# Patient Record
Sex: Female | Born: 2005 | Race: Black or African American | Hispanic: No | Marital: Single | Smoking: Current every day smoker
Health system: Southern US, Community
[De-identification: ages and names within clinical notes are randomized; demographics above are authoritative.]

---
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
## 2024-01-11 ENCOUNTER — Other Ambulatory Visit: Payer: Self-pay

## 2024-01-11 ENCOUNTER — Emergency Department (HOSPITAL_COMMUNITY)
Admission: EM | Admit: 2024-01-11 | Discharge: 2024-01-12 | Discharge status: Against Medical Advice | Attending: Emergency Medicine | Admitting: Emergency Medicine

## 2024-01-11 DIAGNOSIS — R1110 Vomiting, unspecified: Secondary | ICD-10-CM | POA: Insufficient documentation

## 2024-01-11 DIAGNOSIS — Z5321 Procedure and treatment not carried out due to patient leaving prior to being seen by health care provider: Secondary | ICD-10-CM | POA: Diagnosis not present

## 2024-01-11 DIAGNOSIS — R6883 Chills (without fever): Secondary | ICD-10-CM | POA: Insufficient documentation

## 2024-01-11 LAB — HCG, SERUM, QUALITATIVE: Preg, Serum: NEGATIVE

## 2024-01-11 LAB — CBC
HCT: 36.8 % (ref 36.0–46.0)
Hemoglobin: 11.9 g/dL — ABNORMAL LOW (ref 12.0–15.0)
MCH: 29.3 pg (ref 26.0–34.0)
MCHC: 32.3 g/dL (ref 30.0–36.0)
MCV: 90.6 fL (ref 80.0–100.0)
Platelets: 251 K/uL (ref 150–400)
RBC: 4.06 MIL/uL (ref 3.87–5.11)
RDW: 13.1 % (ref 11.5–15.5)
WBC: 9.2 K/uL (ref 4.0–10.5)
nRBC: 0 % (ref 0.0–0.2)

## 2024-01-11 MED ORDER — ONDANSETRON 4 MG PO TBDP
4.0000 mg | ORAL_TABLET | Freq: Once | ORAL | Status: AC
Start: 2024-01-11 — End: 2024-01-11
  Administered 2024-01-11: 4 mg via ORAL
  Filled 2024-01-11: qty 1

## 2024-01-11 NOTE — ED Triage Notes (Signed)
 Pt arrived from home via POV c/o emesis x 4 hours and chills.

## 2024-01-11 NOTE — ED Notes (Signed)
 Pt states she can not provided urine sample at this time that she went just before coming

## 2024-01-12 LAB — COMPREHENSIVE METABOLIC PANEL WITH GFR
ALT: 12 U/L (ref 0–44)
AST: 20 U/L (ref 15–41)
Albumin: 4.6 g/dL (ref 3.5–5.0)
Alkaline Phosphatase: 50 U/L (ref 38–126)
Anion gap: 15 (ref 5–15)
BUN: 8 mg/dL (ref 6–20)
CO2: 17 mmol/L — ABNORMAL LOW (ref 22–32)
Calcium: 9.2 mg/dL (ref 8.9–10.3)
Chloride: 104 mmol/L (ref 98–111)
Creatinine, Ser: 0.84 mg/dL (ref 0.44–1.00)
GFR, Estimated: >60 mL/min (ref >60)
Glucose, Bld: 146 mg/dL — ABNORMAL HIGH (ref 70–99)
Potassium: 3.5 mmol/L (ref 3.5–5.1)
Sodium: 136 mmol/L (ref 135–145)
Total Bilirubin: 0.6 mg/dL (ref 0.0–1.2)
Total Protein: 7.7 g/dL (ref 6.5–8.1)

## 2024-01-12 LAB — LIPASE, BLOOD: Lipase: 23 U/L (ref 11–51)

## 2024-01-12 NOTE — ED Notes (Signed)
 Pt left the waiting room and did not return.

## 2024-01-12 NOTE — ED Notes (Addendum)
 Pt. Left

## 2024-03-04 ENCOUNTER — Ambulatory Visit
Admission: EM | Admit: 2024-03-04 | Discharge: 2024-03-04 | Disposition: A | Discharge status: Home / Self Care | Attending: Nurse Practitioner | Admitting: Nurse Practitioner

## 2024-03-04 DIAGNOSIS — B001 Herpesviral vesicular dermatitis: Secondary | ICD-10-CM

## 2024-03-04 MED ORDER — VALACYCLOVIR HCL 1 G PO TABS: 2000.0000 mg | ORAL_TABLET | Freq: Two times a day (BID) | ORAL | 0 refills | Status: AC

## 2024-03-04 NOTE — ED Provider Notes (Signed)
 [facility]    CSN: [id number] Arrival date & time: 03/04/24  1227      History   Chief Complaint No chief complaint on file.   HPI Leslie Ortega is a 18 y.o. female.   The history is provided by the patient.   Patient presents for complaints of swelling with blisters to the right upper lip.  Patient states symptoms have been present for the past 2 days.  She states that the area is painful and tingling.  She denies injury, trauma, fever, or chills.  Patient states she has been using Blistex with minimal relief of her symptoms. Denies prior history of HSV. Patient was seen for the same or similar symptoms in October and treated with Augmentin .   History reviewed. No pertinent past medical history.  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 No pertinent surgical history.  OB History   No obstetric history on file.      Home Medications    Prior to Admission medications  Medication Sig Start Date End Date Taking? Authorizing Provider  valACYclovir  (VALTREX ) 1000 MG tablet Take 2 tablets (2,000 mg total) by mouth 2 (two) times daily for 1 day. 03/04/24 03/05/24 Yes Leath-Warren, Etta PARAS, NP  acetaminophen  (TYLENOL ) 325 MG tablet Take 2 tablets (650 mg total) by mouth every 6 (six) hours as needed. 07/28/22   Ladora Congress, PA  albuterol  (VENTOLIN  HFA) 108 (90 Base) MCG/ACT inhaler Inhale 2 puffs into the lungs every 6 (six) hours as needed. 07/24/23   Leath-Warren, Etta PARAS, NP  amoxicillin -clavulanate (AUGMENTIN ) 875-125 MG  tablet Take 1 tablet by mouth every 12 (twelve) hours. 12/22/23   Mayer, Jodi R, NP  dicyclomine  (BENTYL ) 20 MG tablet Take 1 tablet (20 mg total) by mouth 2 (two) times daily as needed for spasms. 08/17/22   Silver Wonda LABOR, PA  famotidine  (PEPCID ) 20 MG tablet Take 1 tablet (20 mg total) by mouth 2 (two) times daily. 08/18/22   Blue, Soijett A, PA-C  ibuprofen  (ADVIL ) 400 MG tablet Take 1 tablet (400 mg total) by mouth every 6 (six) hours as needed. 07/28/22   Ladora Congress, PA  ibuprofen  (ADVIL ,MOTRIN ) 400 MG tablet Take 1 tablet (400 mg total) by mouth every 6 (six) hours as needed. 02/29/16   Van Knee, MD  JUNEL 1.5/30 1.5-30 MG-MCG tablet Take 1 tablet by mouth daily. 07/25/22   [provider]  norgestimate-ethinyl estradiol (ORTHO-CYCLEN) 0.25-35 MG-MCG tablet Take 1 tablet by mouth daily. 08/02/22   [provider]  ondansetron  (ZOFRAN ) 4 MG tablet Take 1 tablet (4 mg total) by mouth every 6 (six) hours. 08/18/22   Blue, Soijett A, PA-C  ondansetron  (ZOFRAN -ODT) 4 MG disintegrating tablet Take 1 tablet (4 mg total) by mouth every 8 (eight) hours as needed for nausea or vomiting. 07/28/22   Ladora Congress, PA  ondansetron  (ZOFRAN -ODT) 4 MG disintegrating tablet Take 1 tablet (4 mg total) by mouth every 8 (eight) hours as needed for nausea or vomiting. 06/15/23   Ula Prentice SAUNDERS, MD  promethazine  (PHENERGAN ) 25 MG suppository Place 1 suppository (  25 mg total) rectally every 6 (six) hours as needed for nausea or vomiting. 08/17/22   Silver Wonda LABOR, PA    Family History History reviewed. No pertinent family history.  Social History Social History[1]   Allergies   Patient has no known allergies.   Review of Systems Review of Systems Per HPI  Physical Exam Triage Vital Signs ED Triage Vitals  Encounter Vitals Group     BP 03/04/24 1323 112/68     Girls Systolic BP Percentile --      Girls Diastolic BP Percentile --      Boys Systolic BP Percentile --      Boys Diastolic  BP Percentile --      Pulse Rate 03/04/24 1321 90     Resp 03/04/24 1321 18     Temp 03/04/24 1321 99.4 F (37.4 C)     Temp Source 03/04/24 1321 Oral     SpO2 03/04/24 1321 99 %     Weight --      Height --      Head Circumference --      Peak Flow --      Pain Score 03/04/24 1322 10     Pain Loc --      Pain Education --      Exclude from Growth Chart --    No data found.  Updated Vital Signs BP 112/68 (BP Location: Right Arm)   Pulse 90   Temp 99.4 F (37.4 C) (Oral)   Resp 18   LMP 03/02/2024   SpO2 99%   Visual Acuity Right Eye Distance:   Left Eye Distance:   Bilateral Distance:    Right Eye Near:   Left Eye Near:    Bilateral Near:     Physical Exam Vitals and nursing note reviewed.  Constitutional:      General: She is not in acute distress.    Appearance: Normal appearance.  HENT:     Head: Normocephalic.  Eyes:     Extraocular Movements: Extraocular movements intact.     Pupils: Pupils are equal, round, and reactive to light.  Pulmonary:     Effort: Pulmonary effort is normal.  Skin:    General: Skin is warm and dry.     Findings: Rash present. Rash is vesicular.     Comments: Erythema with vesicles noted to the right upper lip.  There is no oozing, fluctuance, or drainage present.  Neurological:     General: No focal deficit present.     Mental Status: She is alert and oriented to person, place, and time.  Psychiatric:        Mood and Affect: Mood normal.        Behavior: Behavior normal.      UC Treatments / Results  Labs (all labs ordered are listed, but only abnormal results are displayed) Labs Reviewed - No data to display  EKG   Radiology No results found.  Procedures Procedures (including critical care time)  Medications Ordered in UC Medications - No data to display  Initial Impression / Assessment and Plan / UC Course  I have reviewed the triage vital signs and the nursing notes.  Pertinent labs & imaging results  that were available during my care of the patient were reviewed by me and considered in my medical decision making (see chart for details).  Patient with vesicles noted to the right upper lip, she also endorses tingling and pain to the affected area.  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  Will treat with valacyclovir  2 g.  Supportive care recommendations were provided and discussed with the patient to include over-the-counter analgesics, applying cool compresses to the area, and to avoid drinking after others, or close oral contact.  Discussed indications with the patient regarding follow-up.  Patient was in agreement with this plan of care and verbalizes understanding.  All questions were answered.  Patient stable for discharge.  Work note was provided.    Final Clinical Impressions(s) / UC Diagnoses   Final diagnoses:  Herpes labialis     Discharge Instructions      Take medication as prescribed. You may take over-the-counter Tylenol  or ibuprofen  as needed for pain, fever, or general discomfort. Apply cool compresses to the lip to help with pain or swelling. Do not pick or disrupt the areas while symptoms persist. Avoid drinking after others, or close oral contact while symptoms persist. If symptoms fail to improve with this treatment, you may follow-up in this clinic or with your primary care physician for further evaluation. Follow-up as needed.     ED Prescriptions     Medication Sig Dispense Auth. Provider   valACYclovir  (VALTREX ) 1000 MG tablet Take 2 tablets (2,000 mg total) by mouth 2 (two) times daily for 1 day. 4 tablet Leath-Warren, Etta PARAS, NP      PDMP not reviewed this encounter.    [1]  Social History Tobacco Use   Smoking status: Every Day    Types: E-cigarettes   Smokeless tobacco: Current  Vaping Use   Vaping status: Every Day   Substances: Nicotine, Mixture of cannabinoids  Substance Use Topics   Alcohol use: Yes    Comment: socially    Drug use: Yes    Types: Marijuana     Gilmer Etta PARAS, NP 03/04/24 1506

## 2024-03-04 NOTE — Discharge Instructions (Signed)
 Take medication as prescribed. You may take over-the-counter Tylenol  or ibuprofen  as needed for pain, fever, or general discomfort. Apply cool compresses to the lip to help with pain or swelling. Do not pick or disrupt the areas while symptoms persist. Avoid drinking after others, or close oral contact while symptoms persist. If symptoms fail to improve with this treatment, you may follow-up in this clinic or with your primary care physician for further evaluation. Follow-up as needed.

## 2024-03-04 NOTE — ED Triage Notes (Signed)
 Pt reports a bump has formed on the right side of her lip that is tingling and painful  x 1 day.   Denies allergies
